# Patient Record
Sex: Male | Born: 1985 | Race: Black or African American | Hispanic: No | Marital: Married | Smoking: Never smoker
Health system: Southern US, Community
[De-identification: ages and names within clinical notes are randomized; demographics above are authoritative.]

## PROBLEM LIST (undated history)

## (undated) ENCOUNTER — Emergency Department (HOSPITAL_COMMUNITY): Admission: EM | Payer: BC Managed Care – PPO | Source: Home / Self Care

## (undated) DIAGNOSIS — S43006A Unspecified dislocation of unspecified shoulder joint, initial encounter: Secondary | ICD-10-CM

## (undated) HISTORY — PX: SHOULDER ARTHROSCOPY: SHX128

---

## 2009-05-23 ENCOUNTER — Encounter: Admission: RE | Admit: 2009-05-23 | Discharge: 2009-05-23 | Payer: Self-pay | Admitting: Family Medicine

## 2012-01-23 ENCOUNTER — Emergency Department (HOSPITAL_COMMUNITY): Payer: BC Managed Care – PPO

## 2012-01-23 ENCOUNTER — Encounter (HOSPITAL_COMMUNITY): Payer: Self-pay | Admitting: Emergency Medicine

## 2012-01-23 ENCOUNTER — Emergency Department (HOSPITAL_COMMUNITY)
Admission: EM | Admit: 2012-01-23 | Discharge: 2012-01-23 | Disposition: A | Discharge status: Home / Self Care | Payer: BC Managed Care – PPO | Attending: Emergency Medicine | Admitting: Emergency Medicine

## 2012-01-23 DIAGNOSIS — M25519 Pain in unspecified shoulder: Secondary | ICD-10-CM | POA: Insufficient documentation

## 2012-01-23 DIAGNOSIS — W219XXA Striking against or struck by unspecified sports equipment, initial encounter: Secondary | ICD-10-CM | POA: Insufficient documentation

## 2012-01-23 DIAGNOSIS — S43086A Other dislocation of unspecified shoulder joint, initial encounter: Secondary | ICD-10-CM | POA: Insufficient documentation

## 2012-01-23 DIAGNOSIS — S43006A Unspecified dislocation of unspecified shoulder joint, initial encounter: Secondary | ICD-10-CM

## 2012-01-23 DIAGNOSIS — Y9367 Activity, basketball: Secondary | ICD-10-CM | POA: Insufficient documentation

## 2012-01-23 MED ORDER — MIDAZOLAM HCL 2 MG/2ML IJ SOLN
4.0000 mg | Freq: Once | INTRAMUSCULAR | Status: AC
  Administered 2012-01-23: 4 mg via INTRAVENOUS
  Filled 2012-01-23: qty 4

## 2012-01-23 MED ORDER — MIDAZOLAM HCL 5 MG/ML IJ SOLN: 4.0000 mg | Freq: Once | INTRAMUSCULAR | Status: DC

## 2012-01-23 MED ORDER — FENTANYL CITRATE 0.05 MG/ML IJ SOLN
100.0000 ug | Freq: Once | INTRAMUSCULAR | Status: AC
  Administered 2012-01-23: 100 ug via INTRAVENOUS
  Filled 2012-01-23: qty 2

## 2012-01-23 MED ORDER — HYDROCODONE-ACETAMINOPHEN 5-500 MG PO TABS: 1.0000 | ORAL_TABLET | Freq: Four times a day (QID) | ORAL | Status: AC | PRN

## 2012-01-23 NOTE — Progress Notes (Signed)
Orthopedic Tech Progress Note Patient Details:  Leonard Swanson [birth year] [id number]  Other Ortho Devices Type of Ortho Device: Other (comment) (sling arm foam) Ortho Device Location: left arm Ortho Device Interventions: Application   [name] 01/23/2012, 10:07 PM

## 2012-01-23 NOTE — Discharge Instructions (Signed)

## 2012-01-23 NOTE — ED Notes (Signed)
Left should reduced by Dr. Judd Lien. Circulation WNL at this time.

## 2012-01-23 NOTE — ED Notes (Signed)
Left shoulder reduction completed by Dr. Judd Lien. Magda Paganini RN and Insurance account manager at bedside. Pt placed on non-rebreather at 100%.  Suction in place. Crash cart at bedside. Will continue to monitor.

## 2012-01-23 NOTE — ED Provider Notes (Signed)
History     CSN: [id number]  Arrival date & time 01/23/12  2015   First MD Initiated Contact with Patient 01/23/12 2113      Chief Complaint  Patient presents with  . Shoulder Pain    (Consider location/radiation/quality/duration/timing/severity/associated sxs/prior treatment) HPI Comments: Dislocated shoulder in a basketball game.  Was seen at orthopedic clinic and couldn't get it back in and was sent here.  Patient is a 26 y.o. male presenting with shoulder pain. The history is provided by the patient.  Shoulder Pain This is a recurrent problem. Episode onset: 2 hours ago. The problem occurs constantly. The problem has not changed since onset.Exacerbated by: movement, palpation. The symptoms are relieved by nothing. He has tried nothing for the symptoms.    History reviewed. No pertinent past medical history.  History reviewed. No pertinent past surgical history.  No family history on file.  History  Substance Use Topics  . Smoking status: Never Smoker   . Smokeless tobacco: Not on file  . Alcohol Use: No      Review of Systems  All other systems reviewed and are negative.    Allergies  Shellfish allergy  Home Medications  No current outpatient prescriptions on file.  BP 123/73  Pulse 94  Temp(Src) 98.5 F (36.9 C) (Oral)  Resp 18  SpO2 100%  Physical Exam  Nursing note and vitals reviewed. Constitutional: He is oriented to person, place, and time. He appears well-developed and well-nourished. No distress.  HENT:  Head: Normocephalic and atraumatic.  Neck: Normal range of motion. Neck supple.  Musculoskeletal:       The left shoulder is noted to have an obvious glenohumeral dislocation.  The lue is neurovasc intact.  Neurological: He is alert and oriented to person, place, and time.  Skin: Skin is warm and dry. He is not diaphoretic.    ED Course  Reduction of dislocation Date/Time: 01/23/2012 9:48 PM Performed by: Geoffery Lyons Authorized by:  Geoffery Lyons Consent: Verbal consent obtained. Written consent obtained. Risks and benefits: risks, benefits and alternatives were discussed Consent given by: patient Patient understanding: patient states understanding of the procedure being performed Patient consent: the patient's understanding of the procedure matches consent given Procedure consent: procedure consent matches procedure scheduled Relevant documents: relevant documents present and verified Test results: test results available and properly labeled Site marked: the operative site was marked Imaging studies: imaging studies available Patient identity confirmed: verbally with patient and arm band Time out: Immediately prior to procedure a "time out" was called to verify the correct patient, procedure, equipment, support staff and site/side marked as required. Local anesthesia used: no Patient sedated: yes Sedatives: midazolam Analgesia: fentanyl Vitals: Vital signs were monitored during sedation. Patient tolerance: Patient tolerated the procedure well with no immediate complications. Comments: Shoulder was easily reduced with traction-countertraction technique.  Neurovasc intact pre and post reduction.   (including critical care time)  Labs Reviewed - No data to display No results found.   No diagnosis found.    MDM  Post reduction films look okay and the extremity is intact from a vascular and neurological standpoint.  Will be discharged with sling, pain meds, and follow up prn.          Geoffery Lyons, MD 01/23/12 2238

## 2012-01-23 NOTE — ED Notes (Signed)
Patient playing basketball, was knocked onto floor by another player.  Patient seen at [facility] sports medicine, xray disc with patient.  Left shoulder anterior dislocation.  This has happened to patient before, x2.

## 2012-02-29 ENCOUNTER — Telehealth: Payer: Self-pay | Admitting: Cardiovascular Disease

## 2012-02-29 NOTE — Telephone Encounter (Signed)
Close  

## 2012-10-31 ENCOUNTER — Emergency Department (HOSPITAL_COMMUNITY): Payer: BC Managed Care – PPO

## 2012-10-31 ENCOUNTER — Emergency Department (HOSPITAL_COMMUNITY)
Admission: EM | Admit: 2012-10-31 | Discharge: 2012-10-31 | Disposition: A | Discharge status: Home / Self Care | Payer: BC Managed Care – PPO | Attending: Emergency Medicine | Admitting: Emergency Medicine

## 2012-10-31 ENCOUNTER — Encounter (HOSPITAL_COMMUNITY): Payer: Self-pay

## 2012-10-31 DIAGNOSIS — M24419 Recurrent dislocation, unspecified shoulder: Secondary | ICD-10-CM

## 2012-10-31 DIAGNOSIS — Z9889 Other specified postprocedural states: Secondary | ICD-10-CM | POA: Insufficient documentation

## 2012-10-31 DIAGNOSIS — Y9389 Activity, other specified: Secondary | ICD-10-CM | POA: Insufficient documentation

## 2012-10-31 DIAGNOSIS — S43006A Unspecified dislocation of unspecified shoulder joint, initial encounter: Secondary | ICD-10-CM | POA: Insufficient documentation

## 2012-10-31 DIAGNOSIS — X500XXA Overexertion from strenuous movement or load, initial encounter: Secondary | ICD-10-CM | POA: Insufficient documentation

## 2012-10-31 DIAGNOSIS — Y929 Unspecified place or not applicable: Secondary | ICD-10-CM | POA: Insufficient documentation

## 2012-10-31 HISTORY — DX: Unspecified dislocation of unspecified shoulder joint, initial encounter: S43.006A

## 2012-10-31 MED ORDER — MORPHINE SULFATE 4 MG/ML IJ SOLN
4.0000 mg | Freq: Once | INTRAMUSCULAR | Status: AC
  Administered 2012-10-31: 4 mg via INTRAVENOUS
  Filled 2012-10-31: qty 1

## 2012-10-31 MED ORDER — ETOMIDATE 2 MG/ML IV SOLN
INTRAVENOUS | Status: AC
  Administered 2012-10-31: 10 mg via INTRAVENOUS
  Filled 2012-10-31: qty 10

## 2012-10-31 MED ORDER — ETOMIDATE 2 MG/ML IV SOLN
10.0000 mg | Freq: Once | INTRAVENOUS | Status: AC
  Administered 2012-10-31: 10 mg via INTRAVENOUS

## 2012-10-31 MED ORDER — NALOXONE HCL 0.4 MG/ML IJ SOLN
INTRAMUSCULAR | Status: AC
  Filled 2012-10-31: qty 1

## 2012-10-31 NOTE — ED Notes (Signed)
Pt states he woke up and his left shoulder was dislocated. Has had problems in the past with the same shoulder popping out. States it was behind the pillow and when he tried to move felt the pain.

## 2012-10-31 NOTE — ED Provider Notes (Signed)
Patient states his left shoulder became dislocated this morning when he changes position in bed. He complains of left shoulder pain. Patient has had recurrent shoulder dislocations for approximately 6 years. On exam appears mildly uncomfortable Glasgow Coma Score 15 left upper [place] with deformity at shoulder radial pulse 2+ Patient's ASA category 1. N.p.o. since 12 hours ago Procedure: 82 9 AM shoulder reduced by me. Patient was premedicated with etomidate 10 mg IV morphine 8 mg IV. With moderate sedation occurring. His shoulder was reduced using a modified scapular rotation. At 8:33 AM patient is alert Glasgow Coma Score 15 has good sensation over shoulder emblem area. Radial pulse 2+. He was placed in a shoulder immobilizer. Post reduction x-ray reviewed by me  Doug Sou, MD 10/31/12 [phone]

## 2012-10-31 NOTE — ED Provider Notes (Signed)
History     CSN: [id number]  Arrival date & time 10/31/12  8469   First MD Initiated Contact with Patient 10/31/12 [phone]      Chief Complaint  Patient presents with  . Shoulder Injury    (Consider location/radiation/quality/duration/timing/severity/associated sxs/prior treatment) HPI 27 year old male presents the emergency room with chief complaint of left shoulder dislocation.  Patient has had multiple dislocations of bilateral shoulders.  Previous repair of the right shoulder back in [facility] where he is from.  Patient states that this morning he had his left arm behind his wife spell a while he was sleeping.  He went to move in his shoulder dislocated.  He rates his pain currently at 5/10.  He has full movement of his fingers.  He does not complaining of paresthesias.  He has had previous reductions of the shoulder and generally requires sedation. Past Medical History  Diagnosis Date  . Shoulder dislocation     Past Surgical History  Procedure Date  . Shoulder arthroscopy     right shoulder    No family history on file.  History  Substance Use Topics  . Smoking status: Never Smoker   . Smokeless tobacco: Not on file  . Alcohol Use: No      Review of Systems Ten systems reviewed and are negative for acute change, except as noted in the HPI.   Allergies  Shellfish allergy  Home Medications  No current outpatient prescriptions on file.  BP 136/86  Pulse 78  Temp 98.5 F (36.9 C) (Oral)  Resp 20  Ht 5\' 5"  (1.651 m)  Wt 162 lb (73.483 kg)  BMI 26.96 kg/m2  SpO2 100%  Physical Exam Physical Exam  Nursing note and vitals reviewed. Constitutional: He appears well-developed and well-nourished.  Patient appears uncomfortable. HENT:  Head: Normocephalic and atraumatic.  Eyes: Conjunctivae normal are normal. No scleral icterus.  Neck: Normal range of motion. Neck supple.  Cardiovascular: Normal rate, regular rhythm and normal heart sounds.    Pulmonary/Chest: Effort normal and breath sounds normal. No respiratory distress.  Abdominal: Soft. There is no tenderness.  Musculoskeletal: He exhibits no edema.  left shoulder deformity.  Sulcus sign is present.  Grip strength 5 out of 5 bilaterally.  Strong radial pulse.  Sensation intact . Neurological: He is alert.  Skin: Skin is warm and dry. He is not diaphoretic.  Psychiatric: His behavior is normal.    ED Course  Procedures (including critical care time)   shoulder reduction performed  In conjunction with Dr. Ethelda Chick.  Conscious sedation utilized. Please see his notes. Labs Reviewed - No data to display No results found.   1. Shoulder dislocation, recurrent       MDM  8:19 AM BP 136/86  Pulse 78  Temp 98.5 F (36.9 C) (Oral)  Resp 20  Ht 5\' 5"  (1.651 m)  Wt 162 lb (73.483 kg)  BMI 26.96 kg/m2  SpO2 100% Patient seen and shared visit with Dr. Rennis Chris.  Patient will require conscious sedation.     9:51 AM Postreduction x-rays show shoulder is in place.  Patient is alert and ambulatory.  He presents tolerated the procedure well.  He'll be discharged with orthopedic followup.  At this time there does not appear to be any evidence of an acute emergency medical condition and the patient appears stable for discharge with appropriate outpatient follow up.Diagnosis was discussed with patient who verbalizes understanding and is agreeable to discharge. Pt case discussed with Dr. Ethelda Chick who  agrees with my plan.    Arthor Captain, PA-C 10/31/12 [phone]

## 2012-10-31 NOTE — ED Notes (Signed)
Patient transported to X-ray 

## 2012-10-31 NOTE — ED Provider Notes (Signed)
Medical screening examination/treatment/procedure(s) were conducted as a shared visit with non-physician practitioner(s) and myself.  I personally evaluated the patient during the encounter  Doug Sou, MD 10/31/12 1650

## 2012-10-31 NOTE — ED Notes (Signed)
Vital signs stable. 

## 2012-10-31 NOTE — Progress Notes (Signed)
Orthopedic Tech Progress Note Patient Details:  Leonard Swanson 12/17/1985 [id number] Sling immobilizer applied after reduction of shoulder. Ortho Devices Type of Ortho Device: Sling immobilizer Ortho Device/Splint Location: Left    [name] 10/31/2012, 2:20 PM

## 2013-10-06 ENCOUNTER — Emergency Department (HOSPITAL_COMMUNITY): Payer: BC Managed Care – PPO

## 2013-10-06 ENCOUNTER — Encounter (HOSPITAL_COMMUNITY): Payer: Self-pay | Admitting: Emergency Medicine

## 2013-10-06 ENCOUNTER — Emergency Department (HOSPITAL_COMMUNITY)
Admission: EM | Admit: 2013-10-06 | Discharge: 2013-10-06 | Disposition: A | Discharge status: Home / Self Care | Payer: BC Managed Care – PPO | Attending: Emergency Medicine | Admitting: Emergency Medicine

## 2013-10-06 DIAGNOSIS — S43016A Anterior dislocation of unspecified humerus, initial encounter: Secondary | ICD-10-CM | POA: Insufficient documentation

## 2013-10-06 DIAGNOSIS — Z9889 Other specified postprocedural states: Secondary | ICD-10-CM | POA: Insufficient documentation

## 2013-10-06 DIAGNOSIS — X500XXA Overexertion from strenuous movement or load, initial encounter: Secondary | ICD-10-CM | POA: Insufficient documentation

## 2013-10-06 DIAGNOSIS — Y929 Unspecified place or not applicable: Secondary | ICD-10-CM | POA: Insufficient documentation

## 2013-10-06 DIAGNOSIS — Y9389 Activity, other specified: Secondary | ICD-10-CM | POA: Insufficient documentation

## 2013-10-06 DIAGNOSIS — S43005A Unspecified dislocation of left shoulder joint, initial encounter: Secondary | ICD-10-CM

## 2013-10-06 MED ORDER — PROPOFOL 10 MG/ML IV BOLUS
INTRAVENOUS | Status: AC
  Administered 2013-10-06: 75 mg via INTRAVENOUS
  Filled 2013-10-06: qty 20

## 2013-10-06 MED ORDER — HYDROMORPHONE HCL PF 1 MG/ML IJ SOLN
1.0000 mg | Freq: Once | INTRAMUSCULAR | Status: AC
  Administered 2013-10-06: 1 mg via INTRAVENOUS
  Filled 2013-10-06: qty 1

## 2013-10-06 MED ORDER — SODIUM CHLORIDE 0.9 % IV BOLUS (SEPSIS)
1000.0000 mL | Freq: Once | INTRAVENOUS | Status: AC
  Administered 2013-10-06: 1000 mL via INTRAVENOUS

## 2013-10-06 MED ORDER — TRAMADOL HCL 50 MG PO TABS
50.0000 mg | ORAL_TABLET | Freq: Four times a day (QID) | ORAL | Status: AC | PRN
Start: 2013-10-06 — End: ?

## 2013-10-06 MED ORDER — ONDANSETRON HCL 4 MG/2ML IJ SOLN
4.0000 mg | Freq: Once | INTRAMUSCULAR | Status: AC
  Administered 2013-10-06: 4 mg via INTRAVENOUS
  Filled 2013-10-06: qty 2

## 2013-10-06 MED ORDER — PROPOFOL 10 MG/ML IV BOLUS
2.0000 mg/kg | Freq: Once | INTRAVENOUS | Status: AC
  Administered 2013-10-06: 30 mg via INTRAVENOUS
  Administered 2013-10-06: 75 mg via INTRAVENOUS

## 2013-10-06 MED ORDER — LORAZEPAM 2 MG/ML IJ SOLN
2.0000 mg | Freq: Once | INTRAMUSCULAR | Status: AC
  Administered 2013-10-06: 2 mg via INTRAVENOUS
  Filled 2013-10-06: qty 1

## 2013-10-06 NOTE — ED Notes (Signed)
Family at bedside. 

## 2013-10-06 NOTE — ED Notes (Signed)
Pt c/o left shoulder dislocation that has hx of same; CMS intact

## 2013-10-06 NOTE — ED Provider Notes (Signed)
CSN: [id number]     Arrival date & time 10/06/13  1111 History   First MD Initiated Contact with Patient 10/06/13 1119     Chief Complaint  Patient presents with  . Shoulder Pain   (Consider location/radiation/quality/duration/timing/severity/associated sxs/prior Treatment) HPI Comments: Patient is a 28 year old male with a history of frequent shoulder dislocations bilaterally who presents with left shoulder dislocation that occurred prior to arrival. 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 Palpation and movement makes the pain worse. No alleviating factors. No injury. Patient reports having surgery on his right shoulder for frequent dislocations and thinks he will have to have surgery on the left. Patient denies numbness/tingling/weakness of left hand and distal arm.    Past Medical History  Diagnosis Date  . Shoulder dislocation    Past Surgical History  Procedure Laterality Date  . Shoulder arthroscopy      right shoulder   History reviewed. No pertinent family history. History  Substance Use Topics  . Smoking status: Never Smoker   . Smokeless tobacco: Not on file  . Alcohol Use: No    Review of Systems  Constitutional: Negative for fever, chills and fatigue.  HENT: Negative for trouble swallowing.   Eyes: Negative for visual disturbance.  Respiratory: Negative for shortness of breath.   Cardiovascular: Negative for chest pain and palpitations.  Gastrointestinal: Negative for nausea, vomiting, abdominal pain and diarrhea.  Genitourinary: Negative for dysuria and difficulty urinating.  Musculoskeletal: Positive for arthralgias. Negative for neck pain.  Skin: Negative for color change.  Neurological: Negative for dizziness and weakness.  Psychiatric/Behavioral: Negative for dysphoric mood.    Allergies  Shellfish allergy  Home Medications  No current  outpatient prescriptions on file. BP 137/88  Pulse 78  Temp(Src) 98 F (36.7 C) (Oral)  Resp 22  Ht 5\' 6"  (1.676 m)  Wt 170 lb (77.111 kg)  BMI 27.45 kg/m2  SpO2 99% Physical Exam  Nursing note and vitals reviewed. Constitutional: He is oriented to person, place, and time. He appears well-developed and well-nourished. No distress.  HENT:  Head: Normocephalic and atraumatic.  Eyes: Conjunctivae and EOM are normal.  Neck: Normal range of motion.  Cardiovascular: Normal rate, regular rhythm and intact distal pulses.  Exam reveals no gallop and no friction rub.   No murmur heard. Pulmonary/Chest: Effort normal and breath sounds normal. He has no wheezes. He has no rales. He exhibits no tenderness.  Musculoskeletal:  Left shoulder deformity. ROM limited due to pain and deformity. Left shoulder generalized tenderness to palpation.   Neurological: He is alert and oriented to person, place, and time. Coordination normal.  Left grip strength intact. Upper extremity sensation equal and intact bilaterally. Speech is goal-oriented.   Skin: Skin is warm and dry.  Psychiatric: He has a normal mood and affect. His behavior is normal.    ED Course  Procedures (including critical care time)  SPLINT APPLICATION Date/Time: 02/06/2013 3:38 PM Authorized by: Emilia BeckKaitlyn Davina Howlett Consent: Verbal consent obtained. Risks and benefits: risks, benefits and alternatives were discussed Consent given by: patient Splint applied by: orthopedic technician Location details: left shoulder Splint type: shoulder immobilizer Post-procedure: The splinted body part was neurovascularly unchanged following the procedure. Patient tolerance: Patient tolerated the procedure well with no immediate complications.     Labs Review Labs Reviewed - No data to display Imaging Review Dg Shoulder Left  10/06/2013  CLINICAL DATA:  Left shoulder dislocation.  EXAM: LEFT SHOULDER - 2+ VIEW  COMPARISON:  Radiograph 10/31/2012   FINDINGS: Findings compatible with anterior shoulder dislocation. Suggestion of lucency along the anterior margin of the glenoid on the transscapular Y-view may be artifactual. Attention on postreduction films is recommended to exclude fracture. Visualized left hemi thorax is unremarkable.  IMPRESSION: Left anterior shoulder dislocation.  Suggestion of lucency along the anterior margin of the glenoid on the transscapular Y-view may be artifactual. Attention on postreduction films is recommended to exclude fracture.   Electronically Signed   By: Annia Belt M.D.   On: 10/06/2013 12:45    EKG Interpretation   None       MDM  No diagnosis found.  12:09 PM Xray pending. Left shoulder appears dislocated. No neurovascular compromise. Vitals stable and patient afebrile.   1:57 PM Patient will have conscious sedation for shoulder reduction.   3:04 PM Shoulder reduced and immobilizer intact. Patient will be discharged with pain medication and Orthopedic follow up. No neurovascular compromise.    Emilia Beck, PA-C 10/06/13 1506

## 2013-10-06 NOTE — ED Provider Notes (Signed)
Medical screening examination/treatment/procedure(s) were performed by non-physician practitioner and as supervising physician I was immediately available for consultation/collaboration.  Breken Nazari L Jadea Shiffer, MD 10/06/13 1734 

## 2013-10-06 NOTE — Discharge Instructions (Signed)
Take Tramadol as needed for pain. Keep shoulder immobilizer intact until Orthopedic follow up. Follow up with Dr. Eulah PontMurphy for further evaluation. Return to the ED with worsening or concerning symptoms.

## 2013-10-06 NOTE — ED Notes (Signed)
Report given to Levonne HubertNatalie Bullock, RN

## 2013-10-06 NOTE — ED Provider Notes (Signed)
CSN: [id number]     Arrival date & time 10/06/13  1111 History   First MD Initiated Contact with Patient 10/06/13 1119     Chief Complaint  Patient presents with  . Shoulder Pain   (Consider location/radiation/quality/duration/timing/severity/associated sxs/prior Treatment) HPI  Past Medical History  Diagnosis Date  . Shoulder dislocation    Past Surgical History  Procedure Laterality Date  . Shoulder arthroscopy      right shoulder   History reviewed. No pertinent family history. History  Substance Use Topics  . Smoking status: Never Smoker   . Smokeless tobacco: Not on file  . Alcohol Use: No    Review of Systems  Allergies  Shellfish allergy  Home Medications   Current Outpatient Rx  Name  Route  Sig  Dispense  Refill  . Multiple Vitamin (MULTIVITAMIN WITH MINERALS) TABS tablet   Oral   Take 1 tablet by mouth daily.         . traMADol (ULTRAM) 50 MG tablet   Oral   Take 1 tablet (50 mg total) by mouth every 6 (six) hours as needed.   15 tablet   0    BP 111/65  Pulse 79  Temp(Src) 98 F (36.7 C) (Oral)  Resp 20  Ht 5\' 6"  (1.676 m)  Wt 170 lb (77.111 kg)  BMI 27.45 kg/m2  SpO2 100% Physical Exam  ED Course  ORTHOPEDIC INJURY TREATMENT Date/Time: 10/06/2013 6:27 PM Performed by: Blake Divine DAVID Authorized by: Blake Divine DAVID Consent: Verbal consent obtained. written consent obtained. Risks and benefits: risks, benefits and alternatives were discussed Consent given by: patient Time out: Immediately prior to procedure a "time out" was called to verify the correct patient, procedure, equipment, support staff and site/side marked as required. Injury location: shoulder Location details: left shoulder Injury type: dislocation Dislocation type: anterior Chronicity: recurrent Pre-procedure neurovascular assessment: neurovascularly intact Pre-procedure distal perfusion: normal Pre-procedure neurological function: normal Patient sedated:  yes Sedation type: moderate (conscious) sedation Sedatives: propofol Sedation start date/time: 10/06/2013 2:08 PM Sedation end date/time: 10/06/2013 2:15 PM Vitals: Vital signs were monitored during sedation. Manipulation performed: yes Reduction method: traction and counter traction Reduction successful: yes X-ray confirmed reduction: yes Immobilization: sling Post-procedure neurovascular assessment: post-procedure neurovascularly intact Post-procedure distal perfusion: normal Post-procedure neurological function: normal Post-procedure range of motion: normal Patient tolerance: Patient tolerated the procedure well with no immediate complications.   (including critical care time) Labs Review Labs Reviewed - No data to display Imaging Review Dg Shoulder Left  10/06/2013   CLINICAL DATA:  Reduction of shoulder dislocation.  EXAM: LEFT SHOULDER - 2+ VIEW  COMPARISON:  Film earlier today.  FINDINGS: The glenohumeral joint now shows normal alignment. No acute fracture is identified.  IMPRESSION: Normal alignment after reduction of the left glenohumeral joint. No underlying acute fracture is identified.   Electronically Signed   By: Irish Lack M.D.   On: 10/06/2013 14:31   Dg Shoulder Left  10/06/2013   CLINICAL DATA:  Left shoulder dislocation.  EXAM: LEFT SHOULDER - 2+ VIEW  COMPARISON:  Radiograph 10/31/2012  FINDINGS: Findings compatible with anterior shoulder dislocation. Suggestion of lucency along the anterior margin of the glenoid on the transscapular Y-view may be artifactual. Attention on postreduction films is recommended to exclude fracture. Visualized left hemi thorax is unremarkable.  IMPRESSION: Left anterior shoulder dislocation.  Suggestion of lucency along the anterior margin of the glenoid on the transscapular Y-view may be artifactual. Attention on postreduction films is recommended to exclude fracture.  Electronically Signed   By: Annia Beltrew  Davis M.D.   On: 10/06/2013 12:45  All  radiology studies independently viewed by me.     EKG Interpretation   None       MDM   1. 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 On exam, shoulder deformed c/w anterior shoulder dislocation, good distal strength, sensation, and pulses. Intact deltoid sensation. Plan reduction.   Unable to perform reduction with just analgesia.  Proceeded with conscious sedation and reduction performed by me.    Reduction successful and discharged with sling and immobilizer.    Candyce ChurnJohn David Alanna Storti, MD 10/06/13 [phone]

## 2014-09-21 IMAGING — CR DG SHOULDER 2+V*L*
3 series · 3 of 3 positions shown · non-contrast
Comparison: 01/23/2012

CLINICAL DATA: Postreduction

LEFT SHOULDER - 2+ VIEW

[x shoulder ap left]
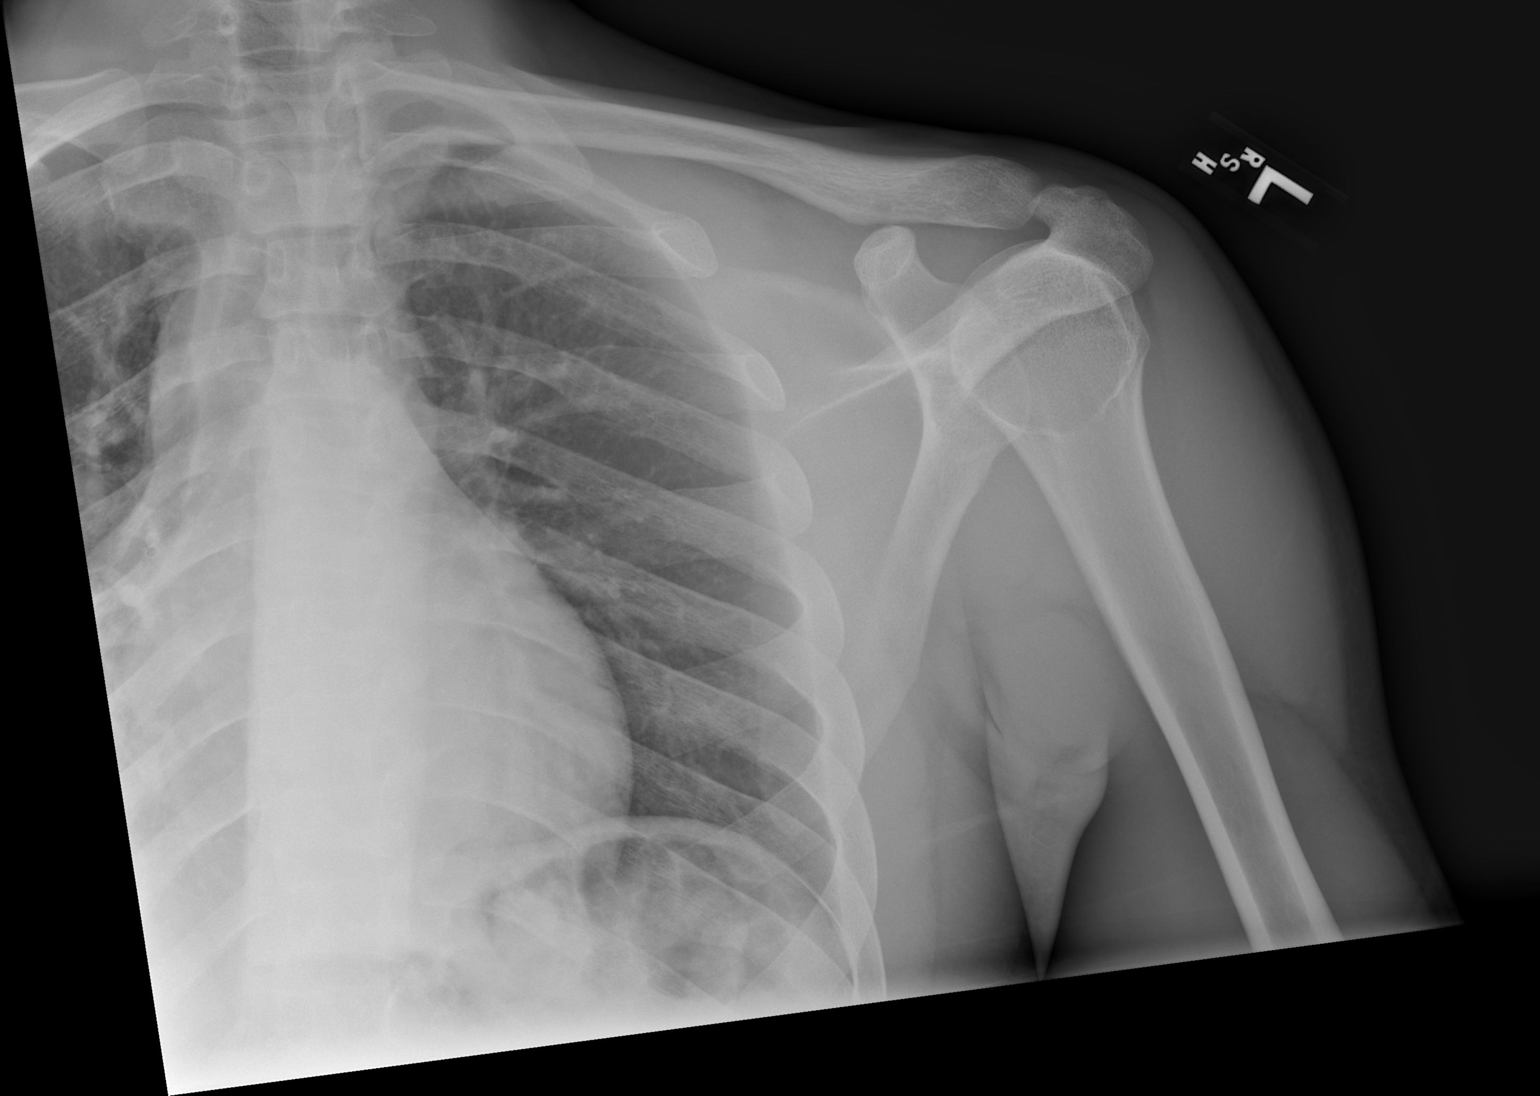

[x shoulder axillary left]
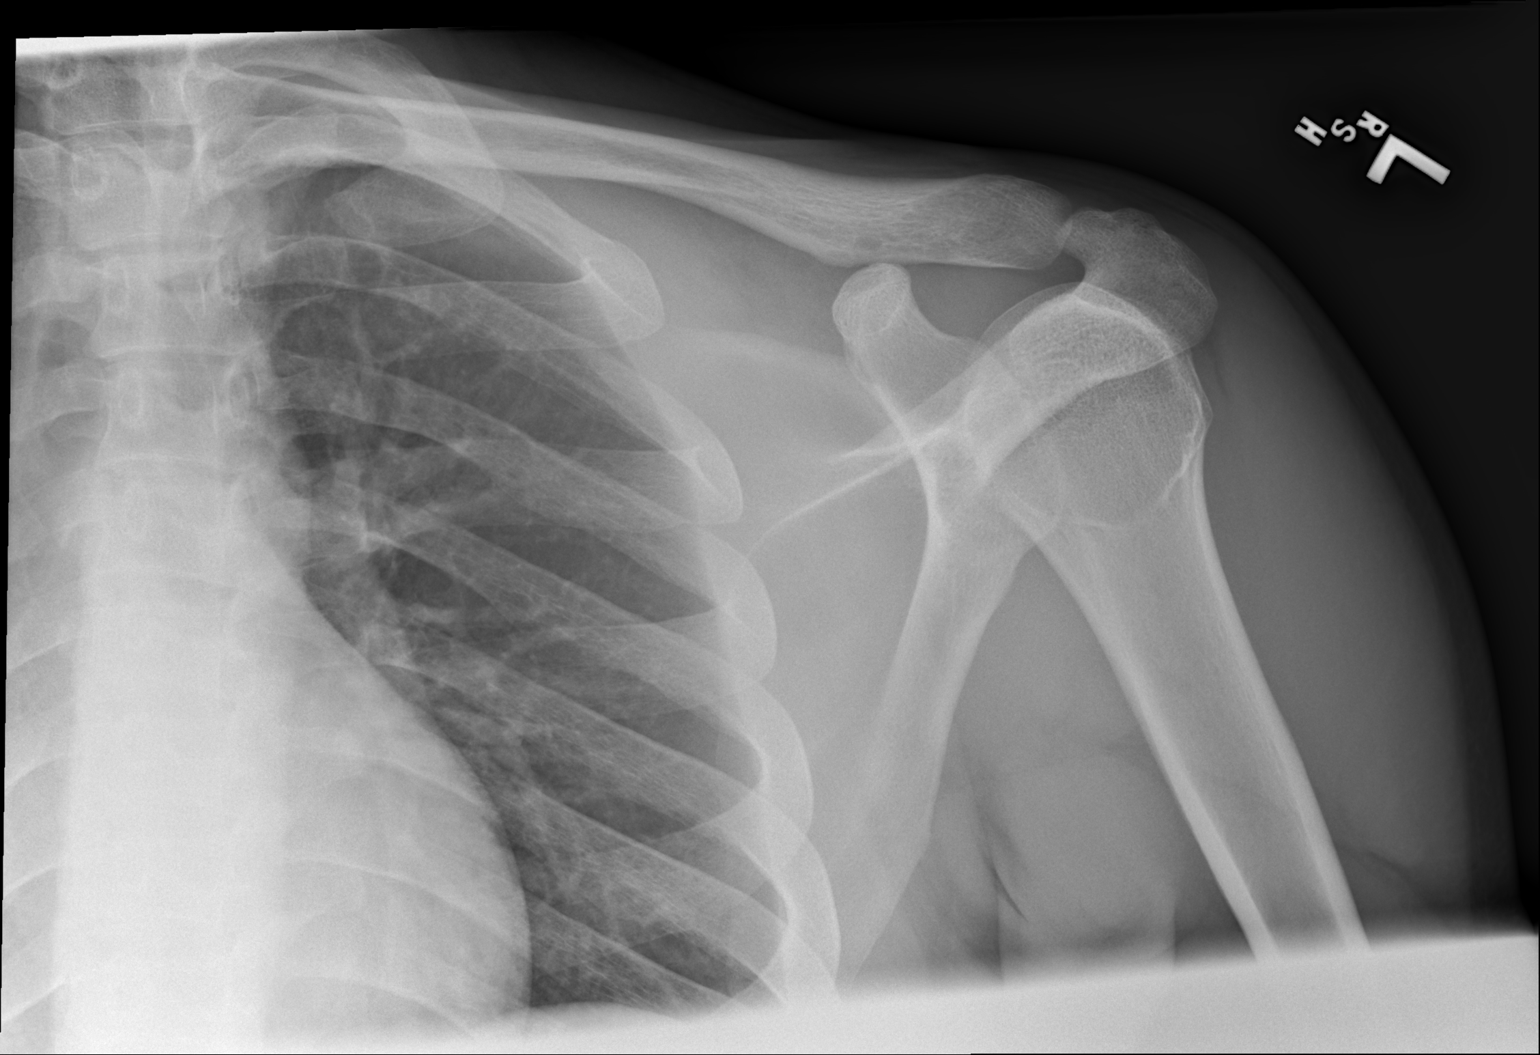

[x scapula y-view left]
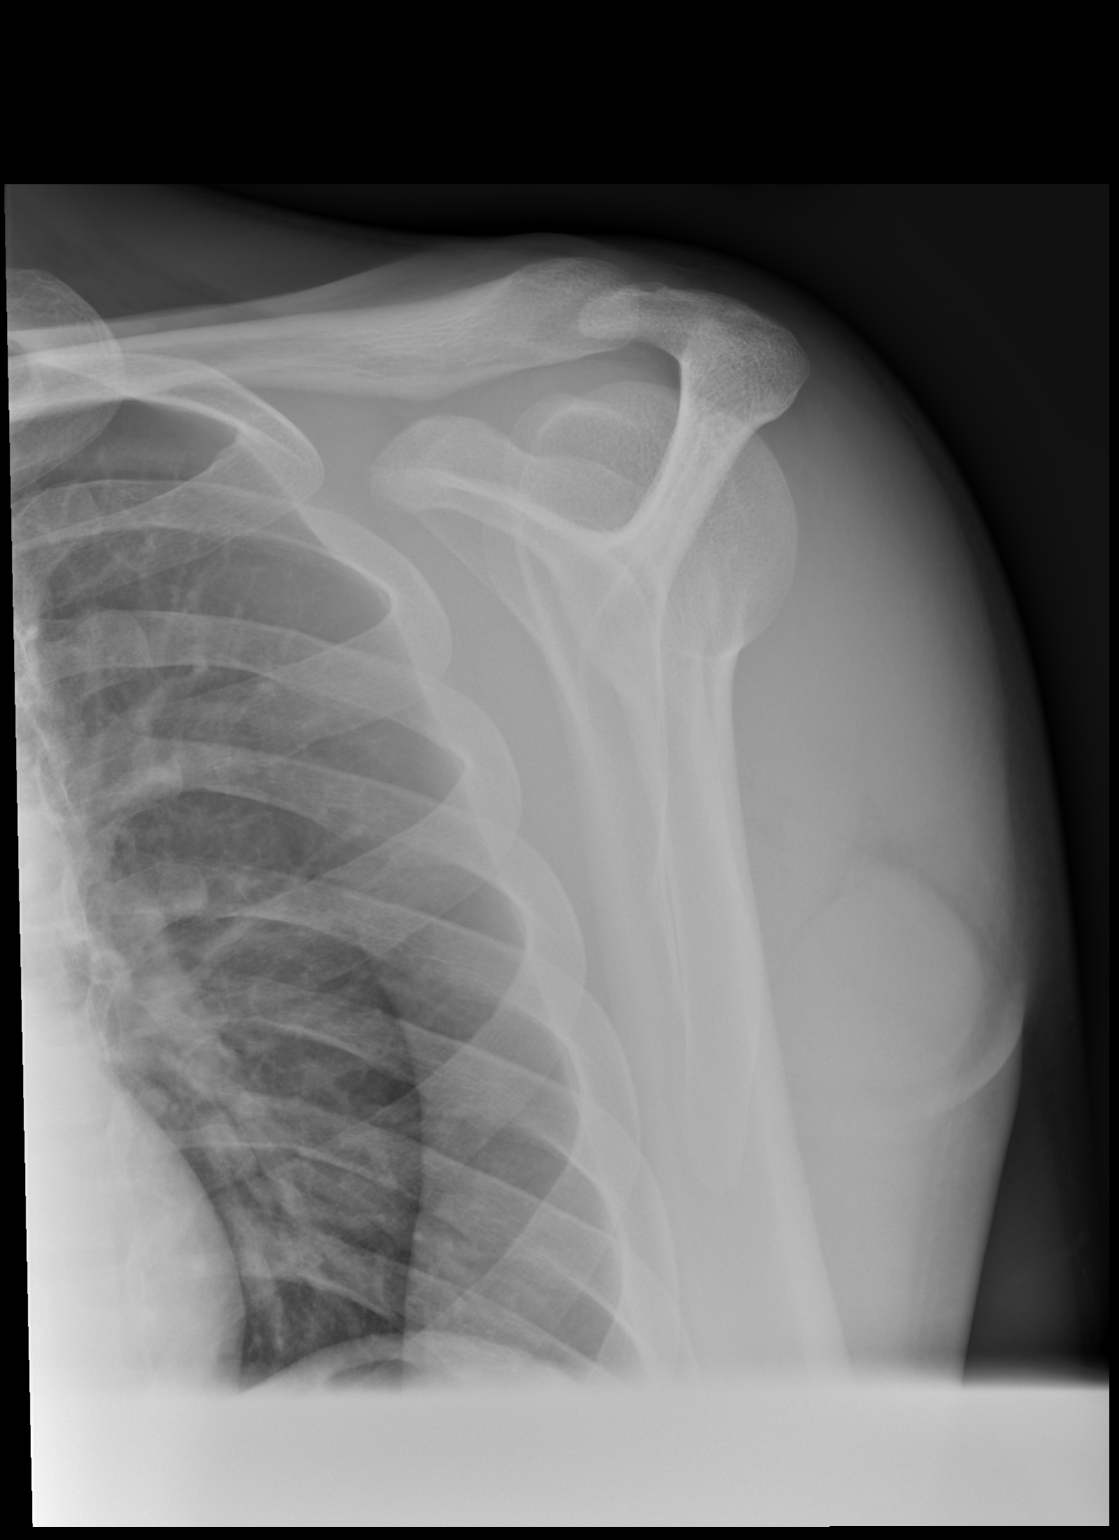

[3 of 3 positions shown; findings below may reference images not displayed]

FINDINGS: Three views of the left shoulder submitted.  No acute
fracture or subluxation.
IMPRESSION: No acute fracture or subluxation.

## 2015-08-27 IMAGING — CR DG SHOULDER 2+V*L*
2 series · 2 of 2 positions shown · non-contrast
Comparison: Radiograph 10/31/2012

CLINICAL DATA: Left shoulder dislocation.

EXAM:
LEFT SHOULDER - 2+ VIEW

[w shoulder ap internal left]
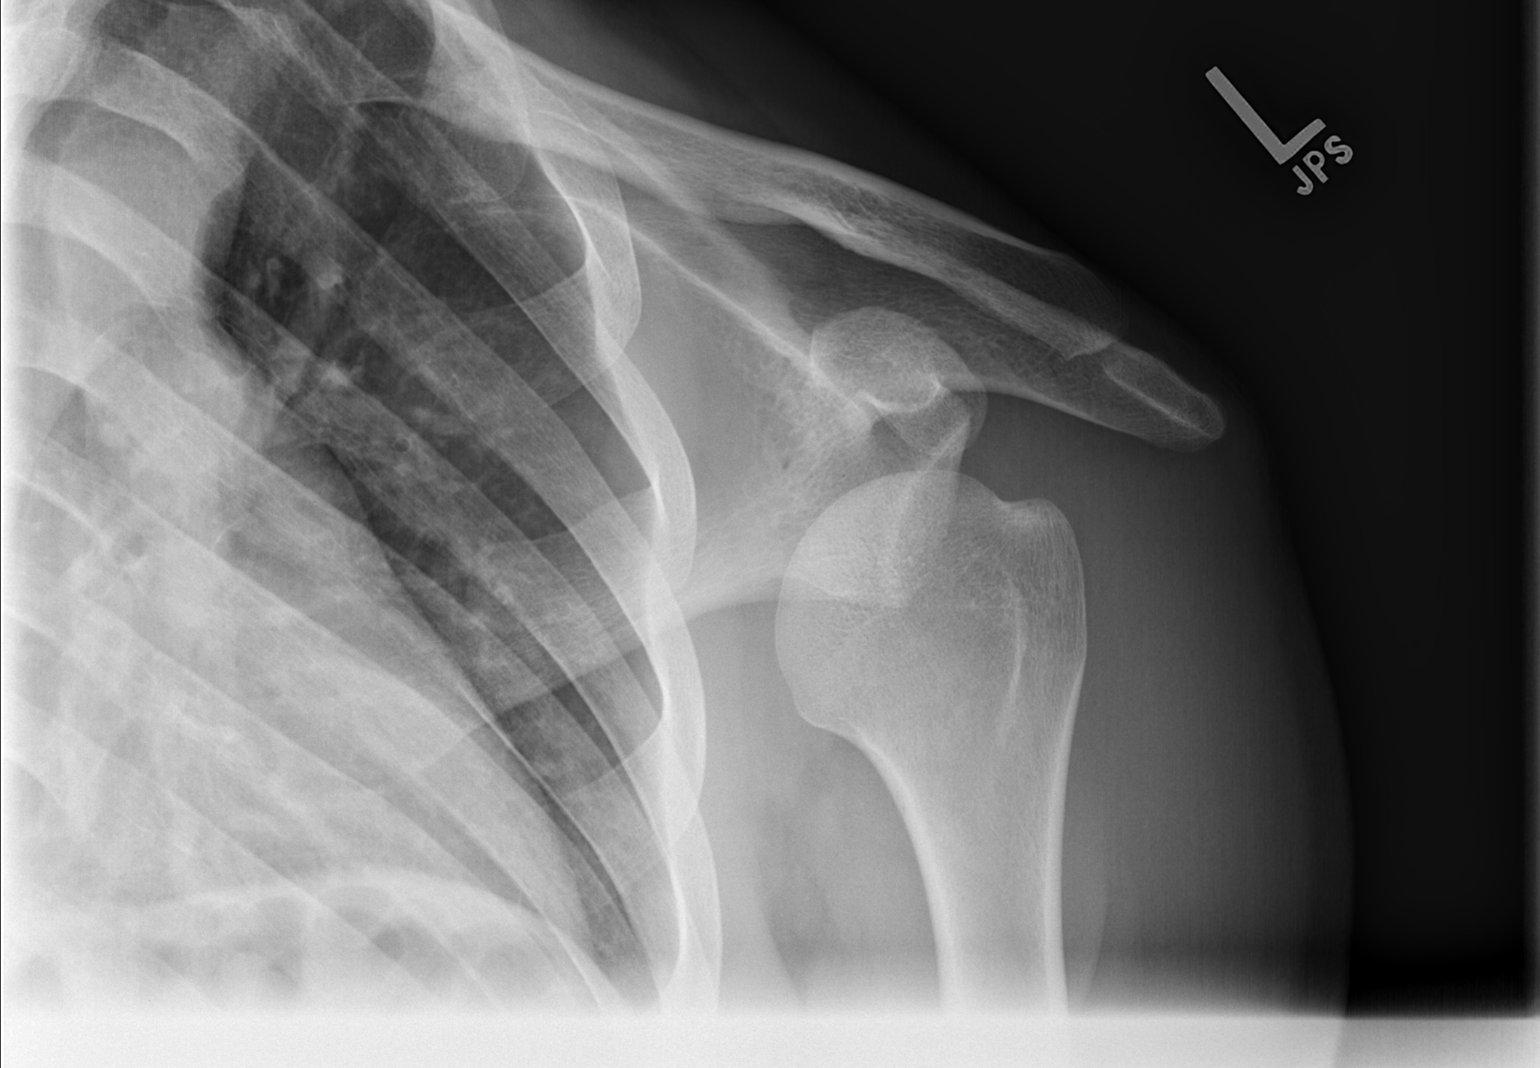

[w shoulder y view left]
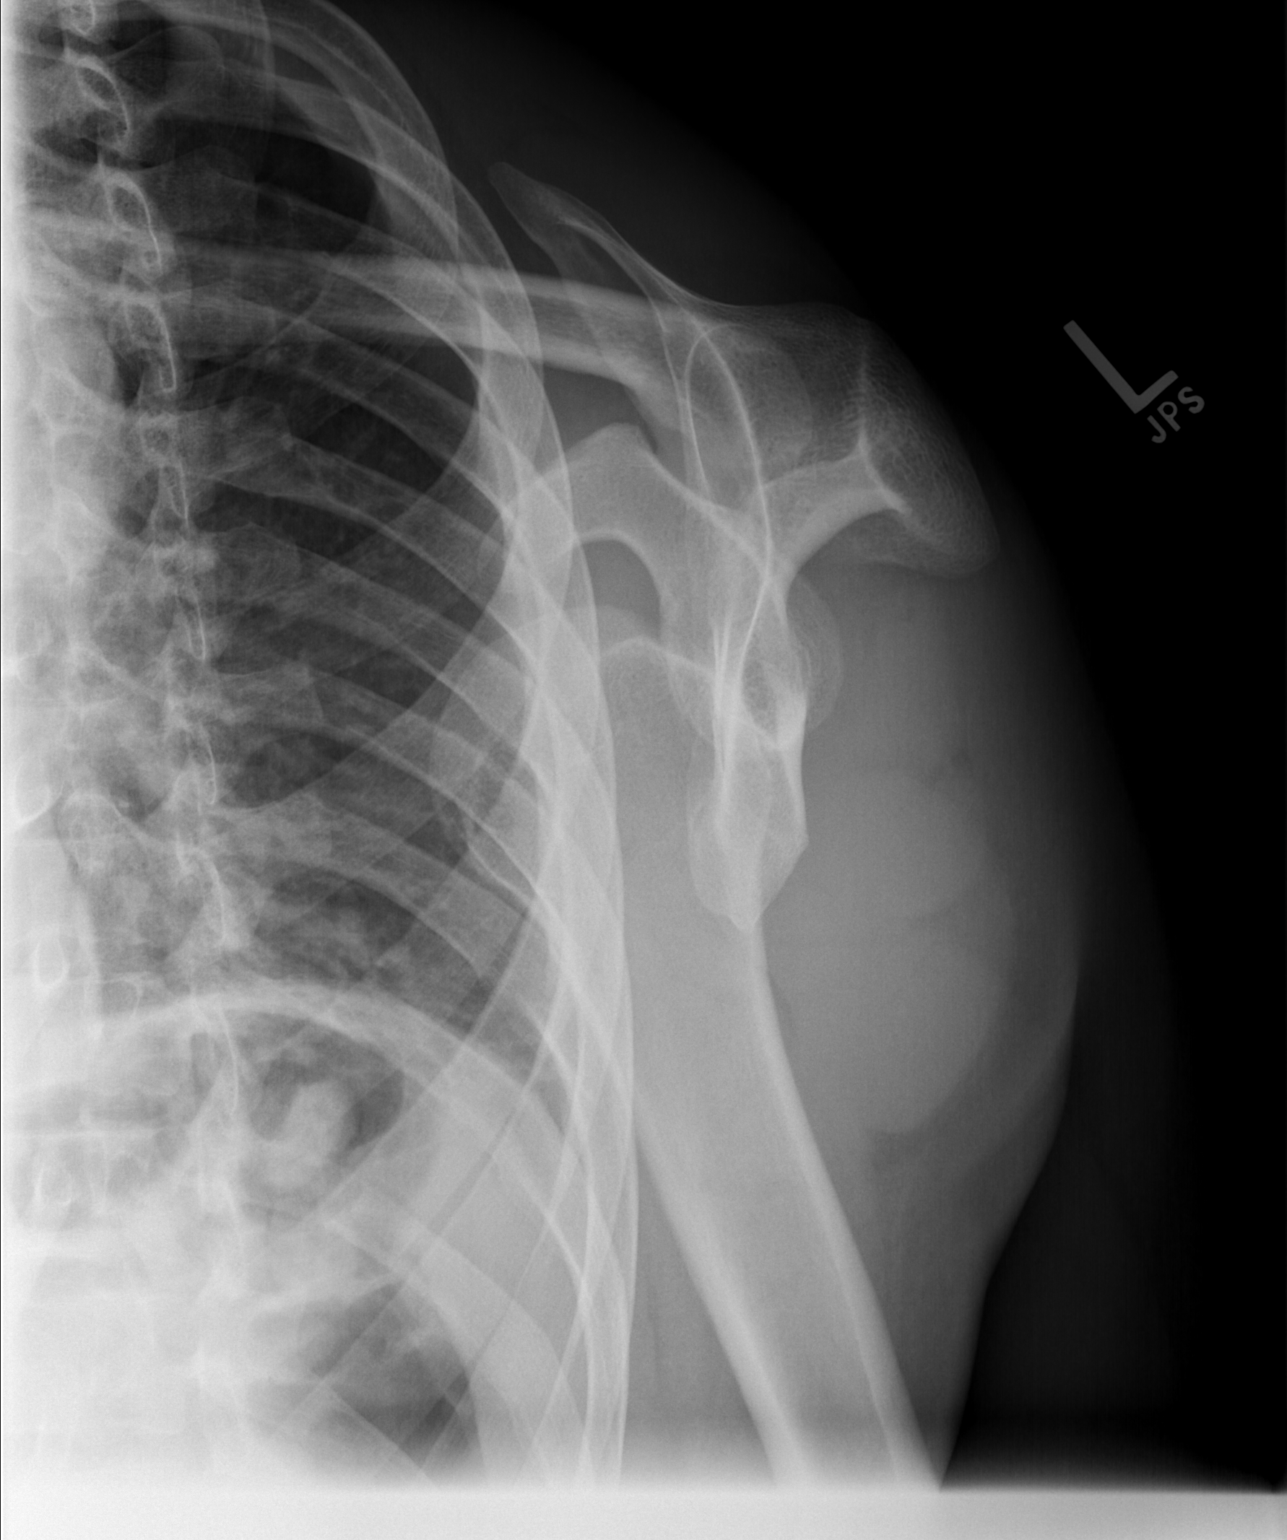

[2 of 2 positions shown; findings below may reference images not displayed]

FINDINGS: Findings compatible with anterior shoulder dislocation. Suggestion
of lucency along the anterior margin of the glenoid on the
transscapular Y-view may be artifactual. Attention on postreduction
films is recommended to exclude fracture. Visualized left hemi
thorax is unremarkable.
IMPRESSION: Left anterior shoulder dislocation.

Suggestion of lucency along the anterior margin of the glenoid on
the transscapular Y-view may be artifactual. Attention on
postreduction films is recommended to exclude fracture.

## 2019-09-25 ENCOUNTER — Other Ambulatory Visit: Payer: Self-pay
